# Patient Record
Sex: Male | Born: 1935 | Marital: Married
Health system: Southern US, Community
[De-identification: ages and names within clinical notes are randomized; demographics above are authoritative.]

---

## 2009-01-28 ENCOUNTER — Emergency Department: Payer: Self-pay | Admitting: Emergency Medicine

## 2009-05-30 ENCOUNTER — Emergency Department: Payer: Self-pay | Admitting: Unknown Physician Specialty

## 2010-07-18 ENCOUNTER — Emergency Department: Payer: Self-pay | Admitting: Emergency Medicine

## 2010-07-19 ENCOUNTER — Inpatient Hospital Stay: Payer: Self-pay | Admitting: Internal Medicine

## 2010-09-19 ENCOUNTER — Ambulatory Visit: Payer: Self-pay

## 2010-10-08 IMAGING — US US RENAL KIDNEY
1 series · 17 of 25 positions shown · non-contrast
Comparison: none

REASON FOR EXAM: chest pain resistant hypertension on 4 blood pressure
medications prior renal st
COMMENTS:   LMP: (Male)

PROCEDURE:     US  - US KIDNEY  - January 29, 2009  [DATE]
RESULT:
HISTORY: Hypertension.

[Series 1: us renal kidney · 17 of 29 slices shown]
[im 1/29]
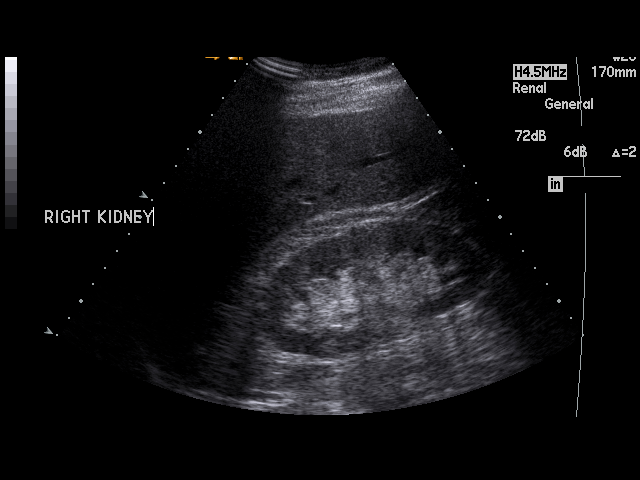
[im 3/29]
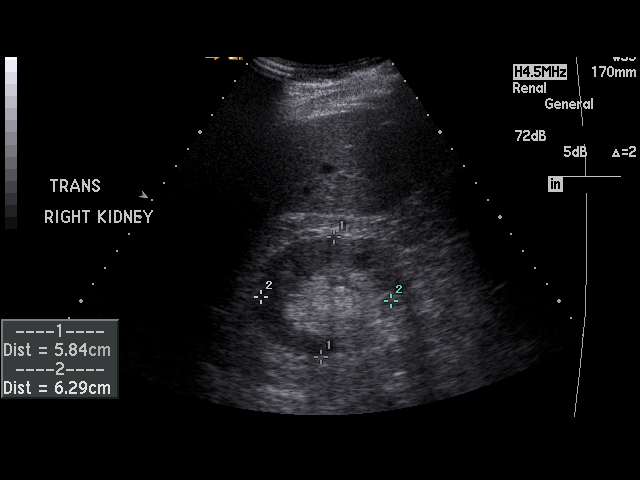
[im 4/29]
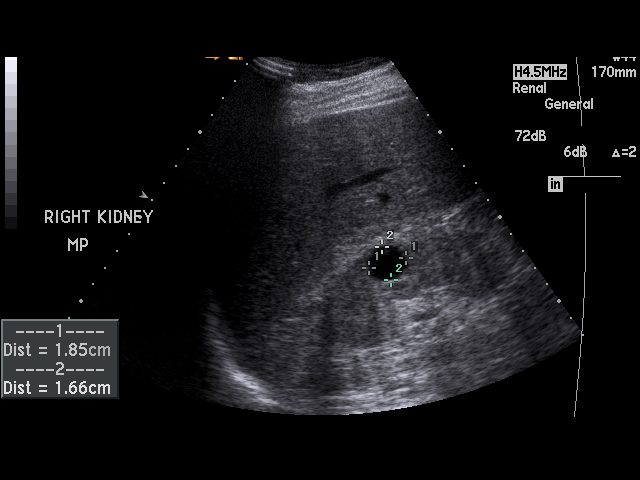
[im 6/29]
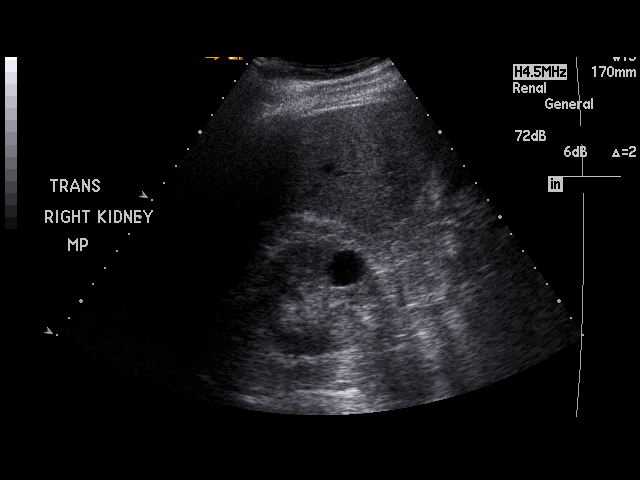
[im 8/29]
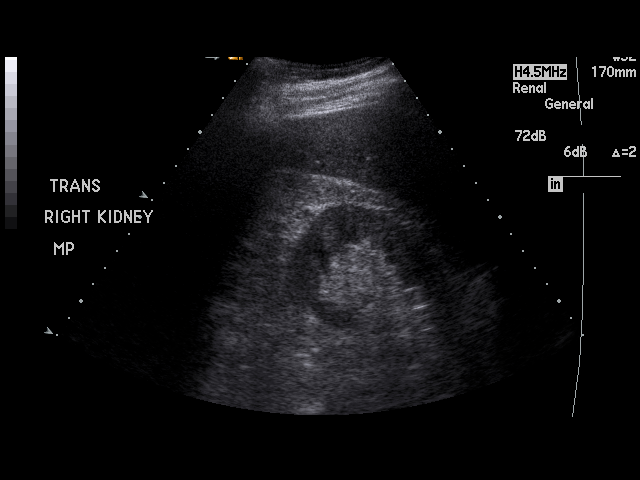
[im 10/29]
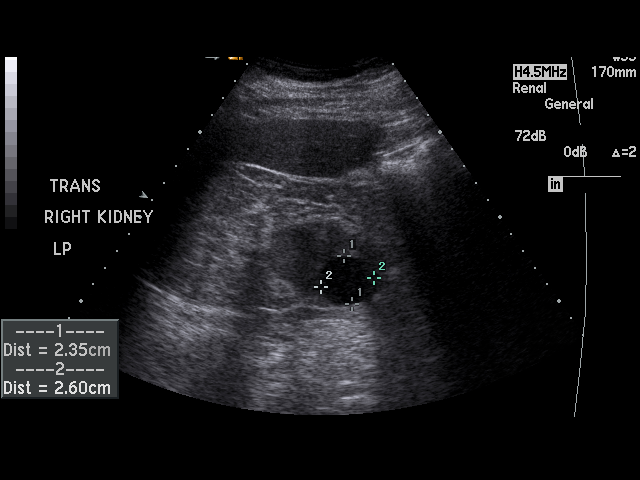
[im 11/29]
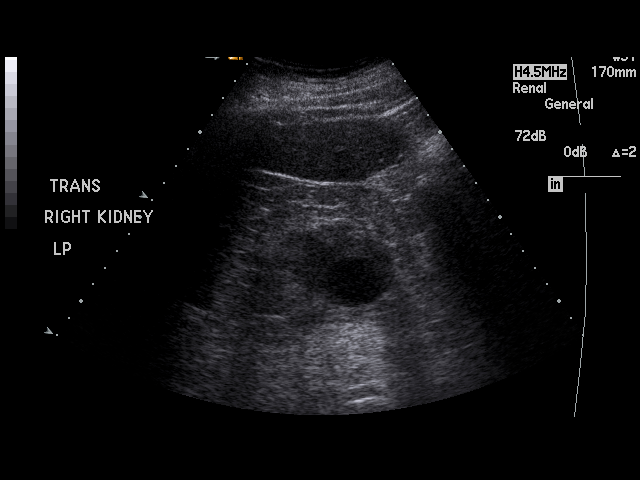
[im 13/29]
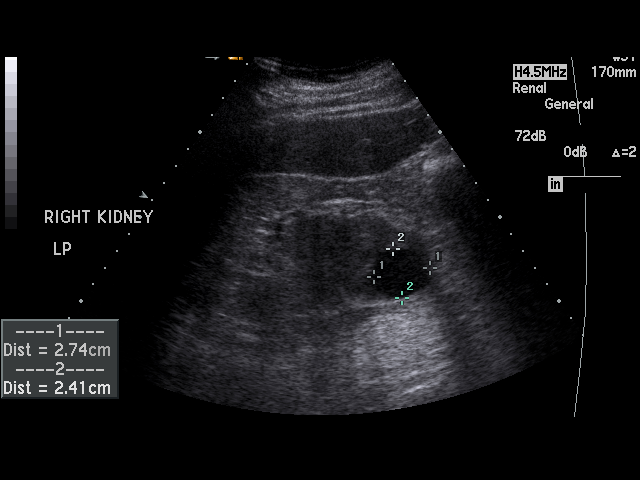
[im 15/29]
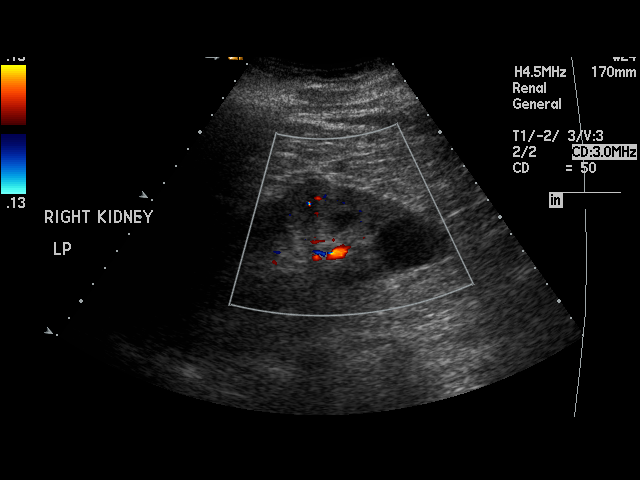
[im 16/29]
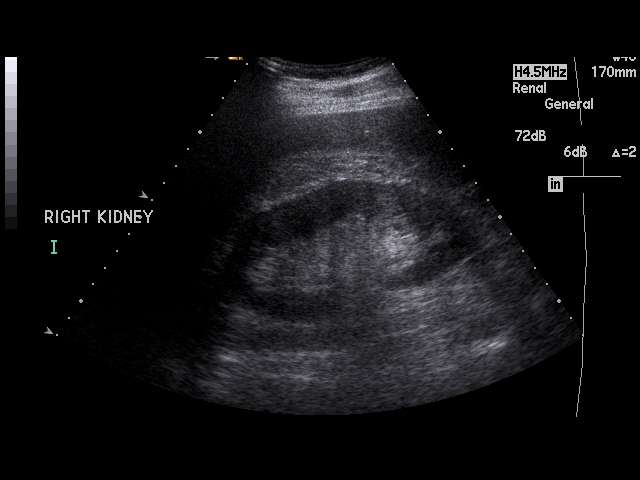
[im 18/29]
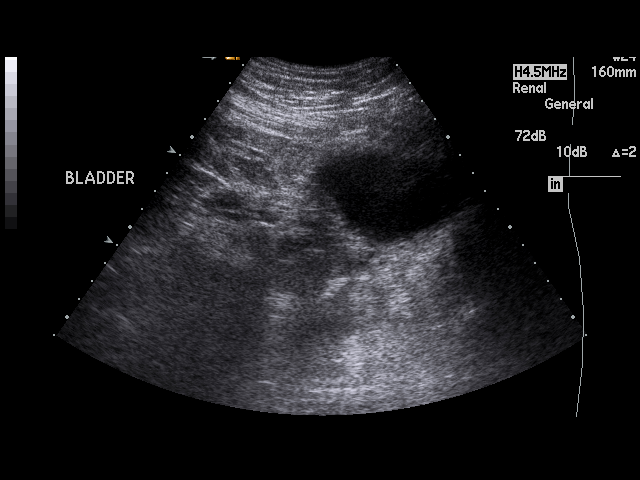
[im 19/29]
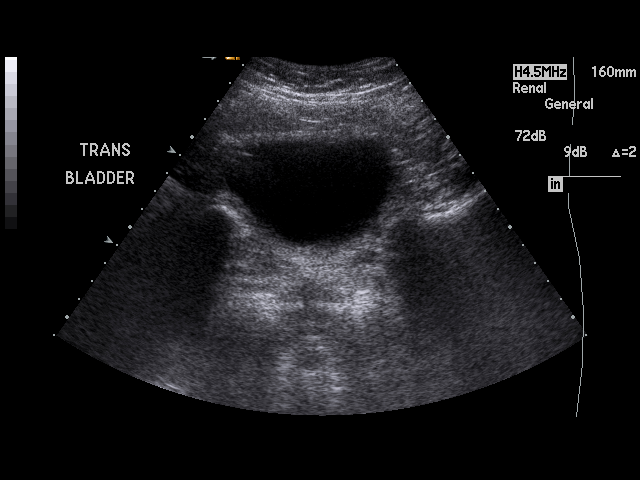
[im 22/29]
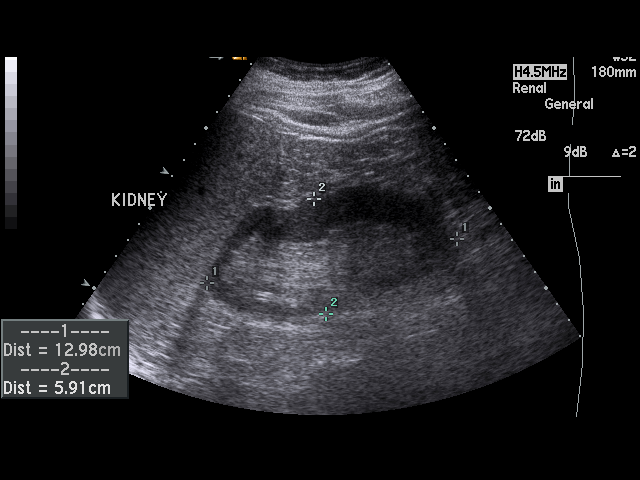
[im 23/29]
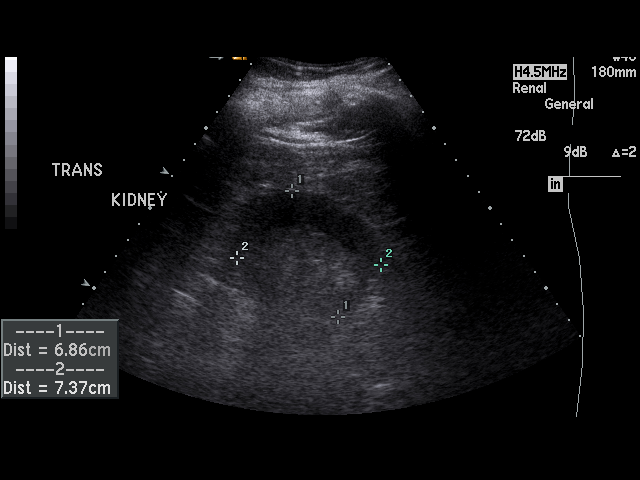
[im 25/29]
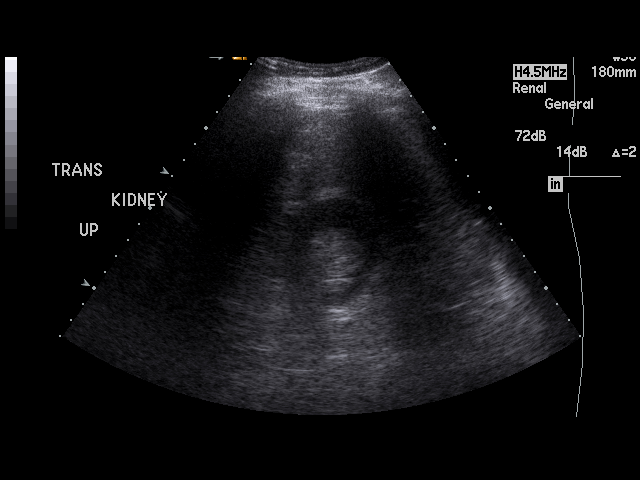
[im 26/29]
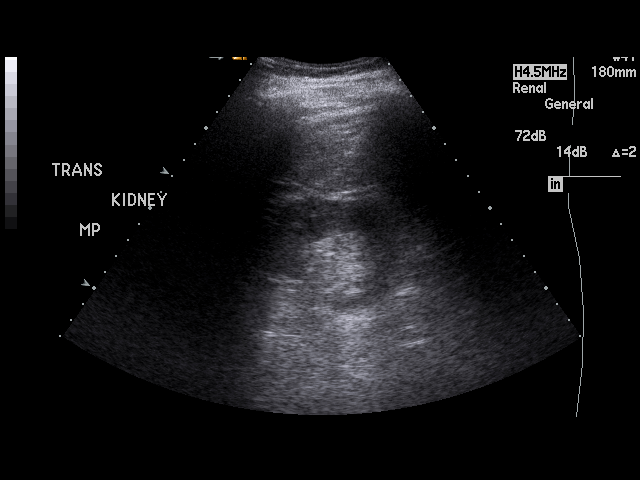
[im 29/29]
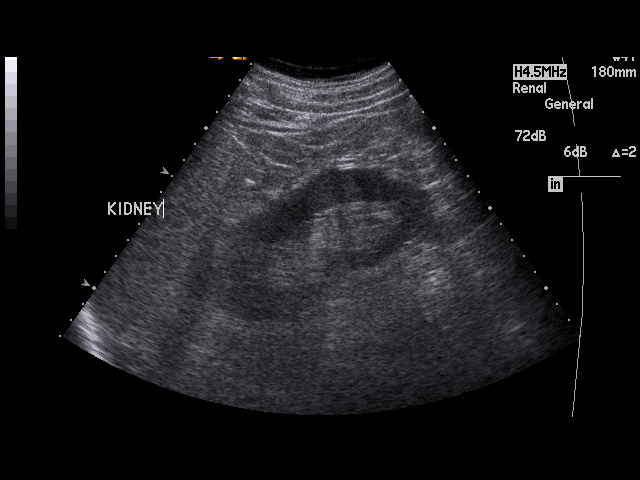

[17 of 25 positions shown; findings below may reference images not displayed]

COMPARISON STUDIES:  No prior.

PROCEDURE AND FININGS:  Standard Renal Ultrasound is obtained.

There is no hydronephrosis. A simple cyst is noted on the right kidney along
its lower pole. Urine is present in the bladder. The right kidney measures
11.1 cm in longitudinal length and the left is 13 cm.
IMPRESSION: No acute abnormality.

## 2011-01-09 ENCOUNTER — Emergency Department: Payer: Self-pay | Admitting: Emergency Medicine

## 2011-01-17 ENCOUNTER — Encounter: Payer: Self-pay | Admitting: Internal Medicine

## 2011-02-04 ENCOUNTER — Encounter: Payer: Self-pay | Admitting: Internal Medicine

## 2011-03-06 ENCOUNTER — Encounter: Payer: Self-pay | Admitting: Internal Medicine

## 2011-05-17 ENCOUNTER — Inpatient Hospital Stay: Payer: Self-pay | Admitting: Student

## 2011-05-21 ENCOUNTER — Inpatient Hospital Stay: Payer: Self-pay | Admitting: Internal Medicine

## 2011-05-22 DIAGNOSIS — R748 Abnormal levels of other serum enzymes: Secondary | ICD-10-CM

## 2011-05-23 DIAGNOSIS — J9600 Acute respiratory failure, unspecified whether with hypoxia or hypercapnia: Secondary | ICD-10-CM

## 2011-05-24 DIAGNOSIS — I059 Rheumatic mitral valve disease, unspecified: Secondary | ICD-10-CM

## 2011-06-06 ENCOUNTER — Ambulatory Visit: Payer: Self-pay | Admitting: Internal Medicine

## 2011-06-12 ENCOUNTER — Encounter: Payer: Self-pay | Admitting: Pulmonary Disease

## 2011-06-13 ENCOUNTER — Institutional Professional Consult (permissible substitution): Payer: Self-pay | Admitting: Pulmonary Disease

## 2011-06-13 ENCOUNTER — Inpatient Hospital Stay: Payer: Self-pay | Admitting: Internal Medicine

## 2011-06-13 DIAGNOSIS — R0602 Shortness of breath: Secondary | ICD-10-CM

## 2011-06-13 LAB — URINALYSIS, COMPLETE
Bacteria: NONE SEEN
Bilirubin,UR: NEGATIVE
Glucose,UR: 50 mg/dL (ref 0–75)
Leukocyte Esterase: NEGATIVE
Nitrite: NEGATIVE
Protein: 100
RBC,UR: 1 /HPF (ref 0–5)
Squamous Epithelial: <1
WBC UR: <1 /HPF (ref 0–5)

## 2011-06-13 LAB — COMPREHENSIVE METABOLIC PANEL
Albumin: 2.7 g/dL — ABNORMAL LOW (ref 3.4–5.0)
Alkaline Phosphatase: 110 U/L (ref 50–136)
BUN: 43 mg/dL — ABNORMAL HIGH (ref 7–18)
Bilirubin,Total: 0.5 mg/dL (ref 0.2–1.0)
Chloride: 103 mmol/L (ref 98–107)
Glucose: 175 mg/dL — ABNORMAL HIGH (ref 65–99)
Osmolality: 296 (ref 275–301)
Potassium: 4.8 mmol/L (ref 3.5–5.1)
SGOT(AST): 20 U/L (ref 15–37)
SGPT (ALT): 20 U/L
Total Protein: 6.8 g/dL (ref 6.4–8.2)

## 2011-06-13 LAB — CBC WITH DIFFERENTIAL/PLATELET
Basophil #: 0 10*3/uL (ref 0.0–0.1)
Basophil %: 0.4 %
Eosinophil #: 0.4 10*3/uL (ref 0.0–0.7)
HCT: 33.6 % — ABNORMAL LOW (ref 40.0–52.0)
HGB: 11 g/dL — ABNORMAL LOW (ref 13.0–18.0)
Lymphocyte #: 1.2 10*3/uL (ref 1.0–3.6)
Lymphocyte %: 15.3 %
MCV: 89 fL (ref 80–100)
Monocyte #: 0.6 10*3/uL (ref 0.0–0.7)
Monocyte %: 8.1 %
Neutrophil #: 5.6 10*3/uL (ref 1.4–6.5)
Platelet: 151 10*3/uL (ref 150–440)
RBC: 3.79 10*6/uL — ABNORMAL LOW (ref 4.40–5.90)
WBC: 7.8 10*3/uL (ref 3.8–10.6)

## 2011-06-13 LAB — TROPONIN I
Troponin-I: <0.02 ng/mL
Troponin-I: <0.02 ng/mL

## 2011-06-13 LAB — PRO B NATRIURETIC PEPTIDE: B-Type Natriuretic Peptide: 7972 pg/mL — ABNORMAL HIGH (ref 0–450)

## 2011-06-13 LAB — CK TOTAL AND CKMB (NOT AT ARMC): CK-MB: <0.5 ng/mL — ABNORMAL LOW (ref 0.5–3.6)

## 2011-06-14 ENCOUNTER — Encounter: Payer: Self-pay | Admitting: Cardiovascular Disease

## 2011-06-14 DIAGNOSIS — I509 Heart failure, unspecified: Secondary | ICD-10-CM

## 2011-06-14 DIAGNOSIS — J9600 Acute respiratory failure, unspecified whether with hypoxia or hypercapnia: Secondary | ICD-10-CM

## 2011-06-14 LAB — BASIC METABOLIC PANEL
Calcium, Total: 8.5 mg/dL (ref 8.5–10.1)
EGFR (Non-African Amer.): 35 — ABNORMAL LOW
Glucose: 107 mg/dL — ABNORMAL HIGH (ref 65–99)
Osmolality: 297 (ref 275–301)
Potassium: 4.3 mmol/L (ref 3.5–5.1)
Sodium: 143 mmol/L (ref 136–145)

## 2011-06-14 LAB — CBC WITH DIFFERENTIAL/PLATELET
Basophil #: 0 10*3/uL (ref 0.0–0.1)
Lymphocyte #: 1.2 10*3/uL (ref 1.0–3.6)
MCH: 28.8 pg (ref 26.0–34.0)
MCV: 88 fL (ref 80–100)
Monocyte #: 0.6 10*3/uL (ref 0.0–0.7)
Monocyte %: 8.6 %
Neutrophil %: 69.7 %
Platelet: 150 10*3/uL (ref 150–440)
RDW: 15.3 % — ABNORMAL HIGH (ref 11.5–14.5)
WBC: 7.4 10*3/uL (ref 3.8–10.6)

## 2011-07-07 ENCOUNTER — Ambulatory Visit: Payer: Self-pay | Admitting: Internal Medicine

## 2012-02-02 ENCOUNTER — Other Ambulatory Visit: Payer: Self-pay | Admitting: Cardiovascular Disease

## 2012-02-05 ENCOUNTER — Emergency Department: Payer: Self-pay | Admitting: Internal Medicine

## 2012-02-05 LAB — COMPREHENSIVE METABOLIC PANEL
Anion Gap: 5 — ABNORMAL LOW (ref 7–16)
BUN: 77 mg/dL — ABNORMAL HIGH (ref 7–18)
Bilirubin,Total: 0.4 mg/dL (ref 0.2–1.0)
Chloride: 104 mmol/L (ref 98–107)
Co2: 29 mmol/L (ref 21–32)
Creatinine: 2.89 mg/dL — ABNORMAL HIGH (ref 0.60–1.30)
Osmolality: 303 (ref 275–301)
Potassium: 4.9 mmol/L (ref 3.5–5.1)
SGPT (ALT): 12 U/L (ref 12–78)
Sodium: 138 mmol/L (ref 136–145)
Total Protein: 6.6 g/dL (ref 6.4–8.2)

## 2012-02-05 LAB — CK TOTAL AND CKMB (NOT AT ARMC)
CK, Total: 29 U/L — ABNORMAL LOW (ref 35–232)
CK-MB: <0.5 ng/mL — ABNORMAL LOW (ref 0.5–3.6)

## 2012-02-05 LAB — CBC
MCH: 30.1 pg (ref 26.0–34.0)
MCHC: 33.1 g/dL (ref 32.0–36.0)
MCV: 91 fL (ref 80–100)

## 2012-02-05 LAB — PRO B NATRIURETIC PEPTIDE: B-Type Natriuretic Peptide: 3177 pg/mL — ABNORMAL HIGH (ref 0–450)

## 2012-03-26 IMAGING — CR DG CHEST 1V PORT
1 series · 1 of 1 positions shown · non-contrast
Comparison: none

REASON FOR EXAM: shortness of breath
COMMENTS:

PROCEDURE:     DXR - DXR PORTABLE CHEST SINGLE VIEW  - July 18, 2010 [DATE]
RESULT:     Comparison: 05/30/2009

[view not recorded]
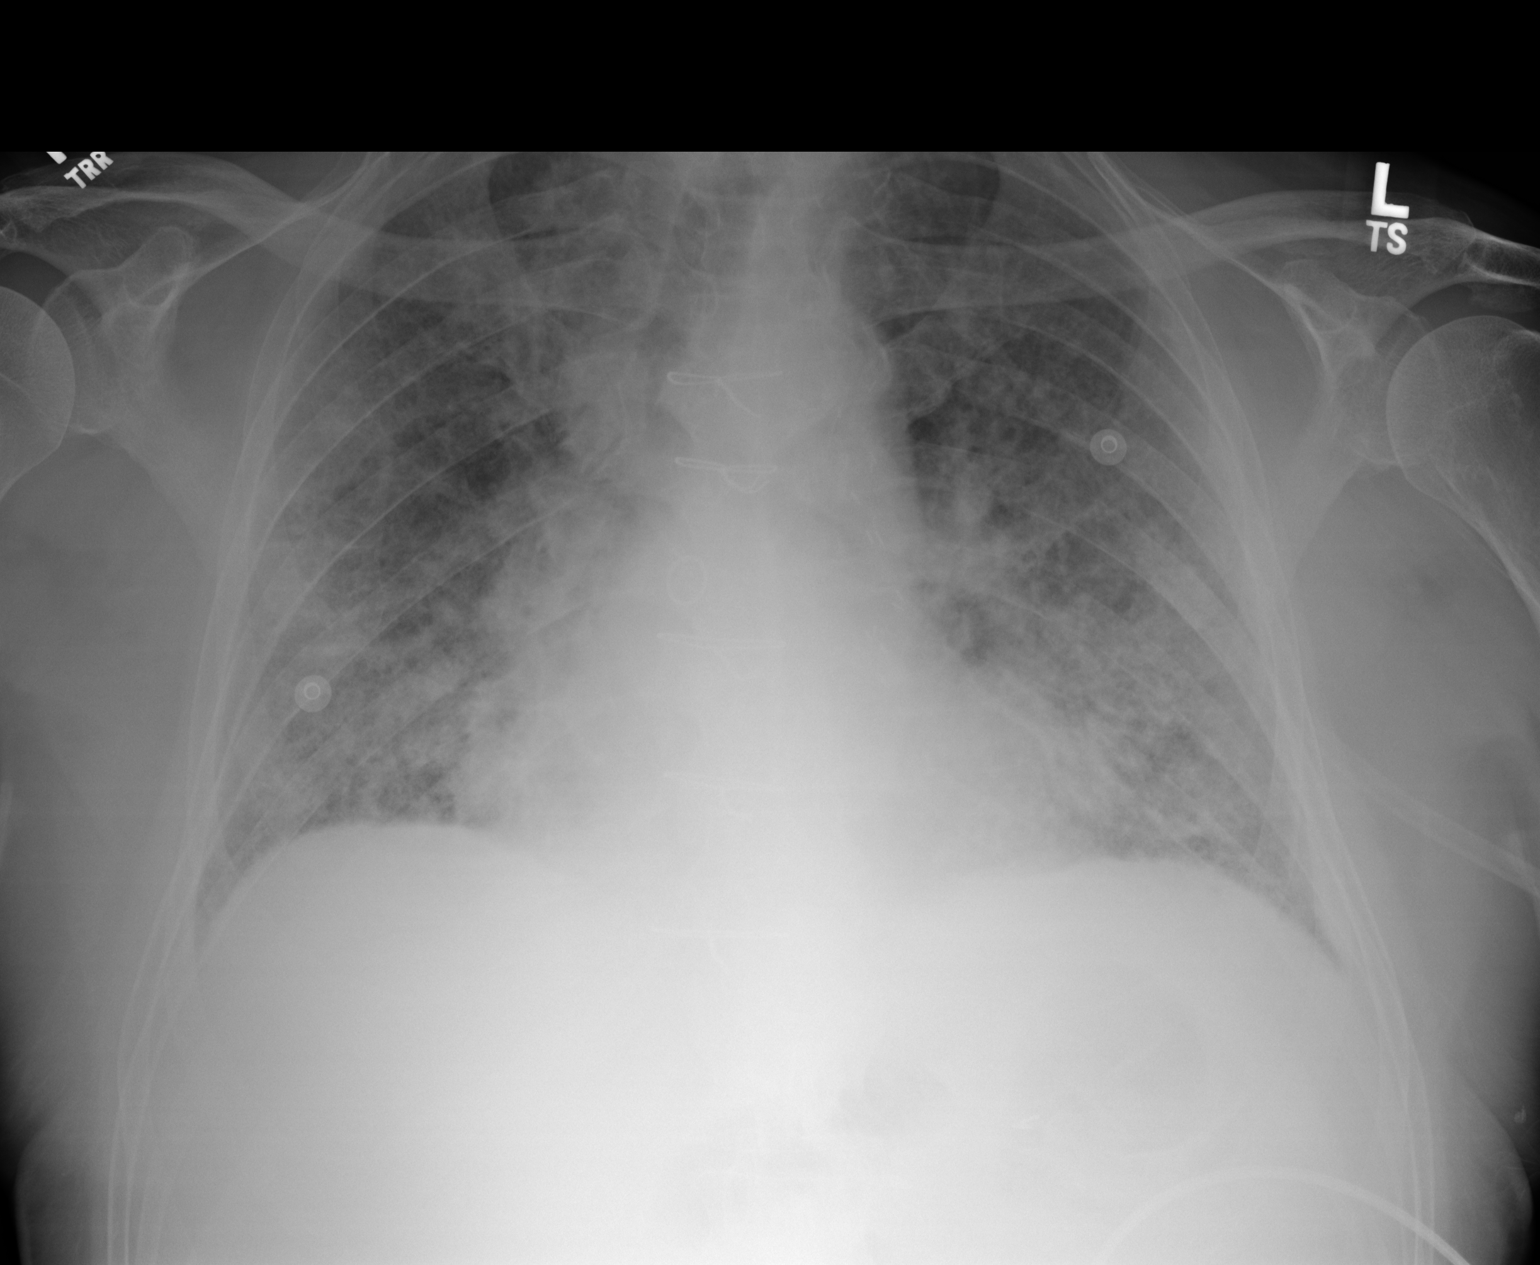

[1 of 1 positions shown; findings below may reference images not displayed]

FINDINGS: Single portable AP chest radiograph is provided. There is bilateral diffuse
interstitial thickening. There is more patchy alveolar airspace opacities in
the lower lobes bilaterally. There is no pleural effusion or pneumothorax.
Normal cardiomediastinal silhouette. The osseous structures are unremarkable.
IMPRESSION: Bilateral diffuse or central thickening with slightly more confluent areas
of alveolar airspace opacities in the lower lobes bilaterally. The side
represents chronic interstitial lung disease with, but superimposed mild
pulmonary edema or interstitial pneumonitis cannot be excluded.

## 2012-06-05 DEATH — deceased

## 2013-01-26 IMAGING — CR DG CHEST 1V PORT
1 series · 1 of 1 positions shown · non-contrast
Comparison: none

REASON FOR EXAM: SOB
COMMENTS:

PROCEDURE:     DXR - DXR PORTABLE CHEST SINGLE VIEW  - May 20, 2011 [DATE]
RESULT:     Comparison is made to a prior study dated 05/17/2011.

[ap]
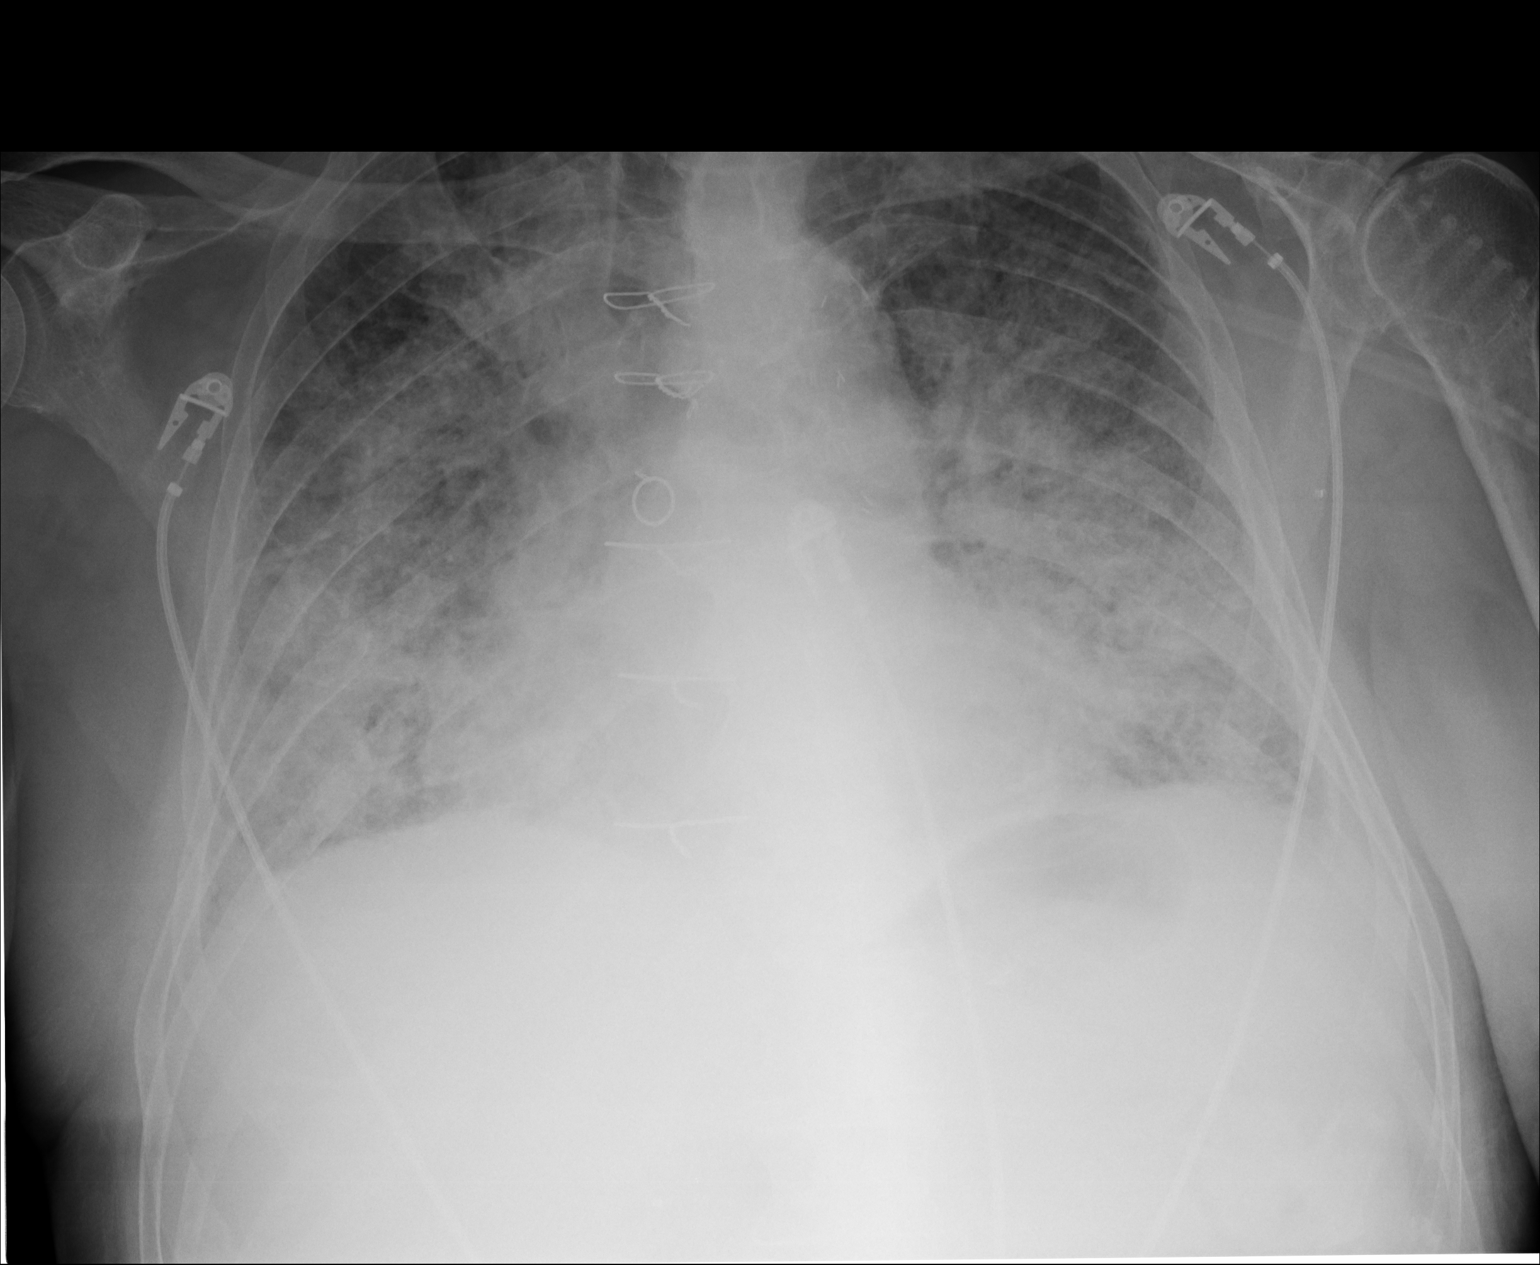

[1 of 1 positions shown; findings below may reference images not displayed]

FINDINGS: Diffuse perihilar opacities are identified as well as diffuse
opacities in the right and left hemithoraces. There is thickening of the
interstitial markings and pulmonary vascular peribronchial cuffing. The
cardiac silhouette is enlarged. The visualized bony skeleton is unremarkable.
IMPRESSION: Findings consistent with pulmonary edema. An underlying infectious
infiltrate is also of diagnostic consideration if clinically appropriate.
Surveillance evaluation is recommended status post appropriate therapeutic
regimen.

## 2013-10-14 IMAGING — CR DG CHEST 2V
1 series · 2 of 2 positions shown · non-contrast
Comparison: none

REASON FOR EXAM: dizziness
COMMENTS:

[Series 1: w chest pa · 0.14mm/px · 2 of 2 slices shown]
[im 1/2]
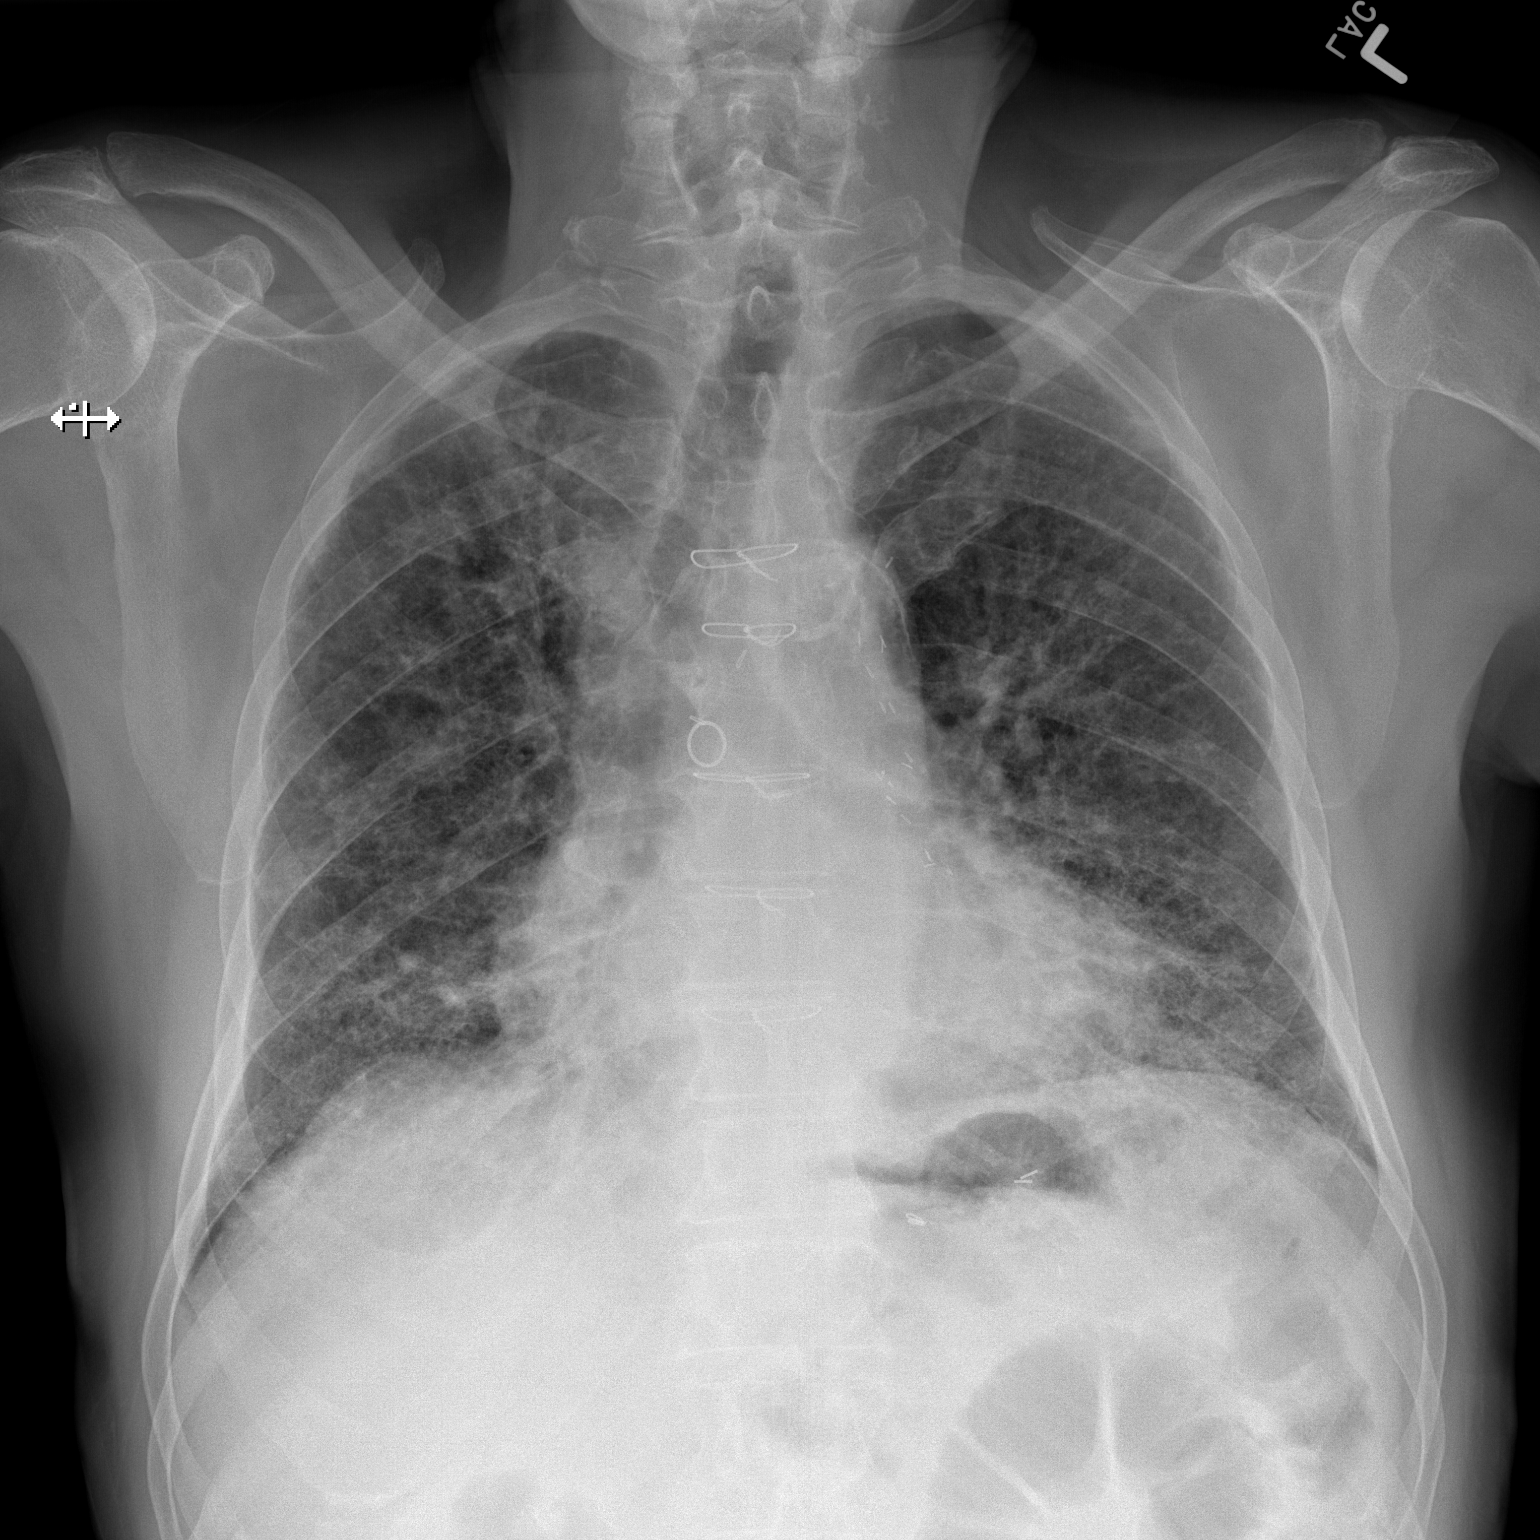
[im 2/2]
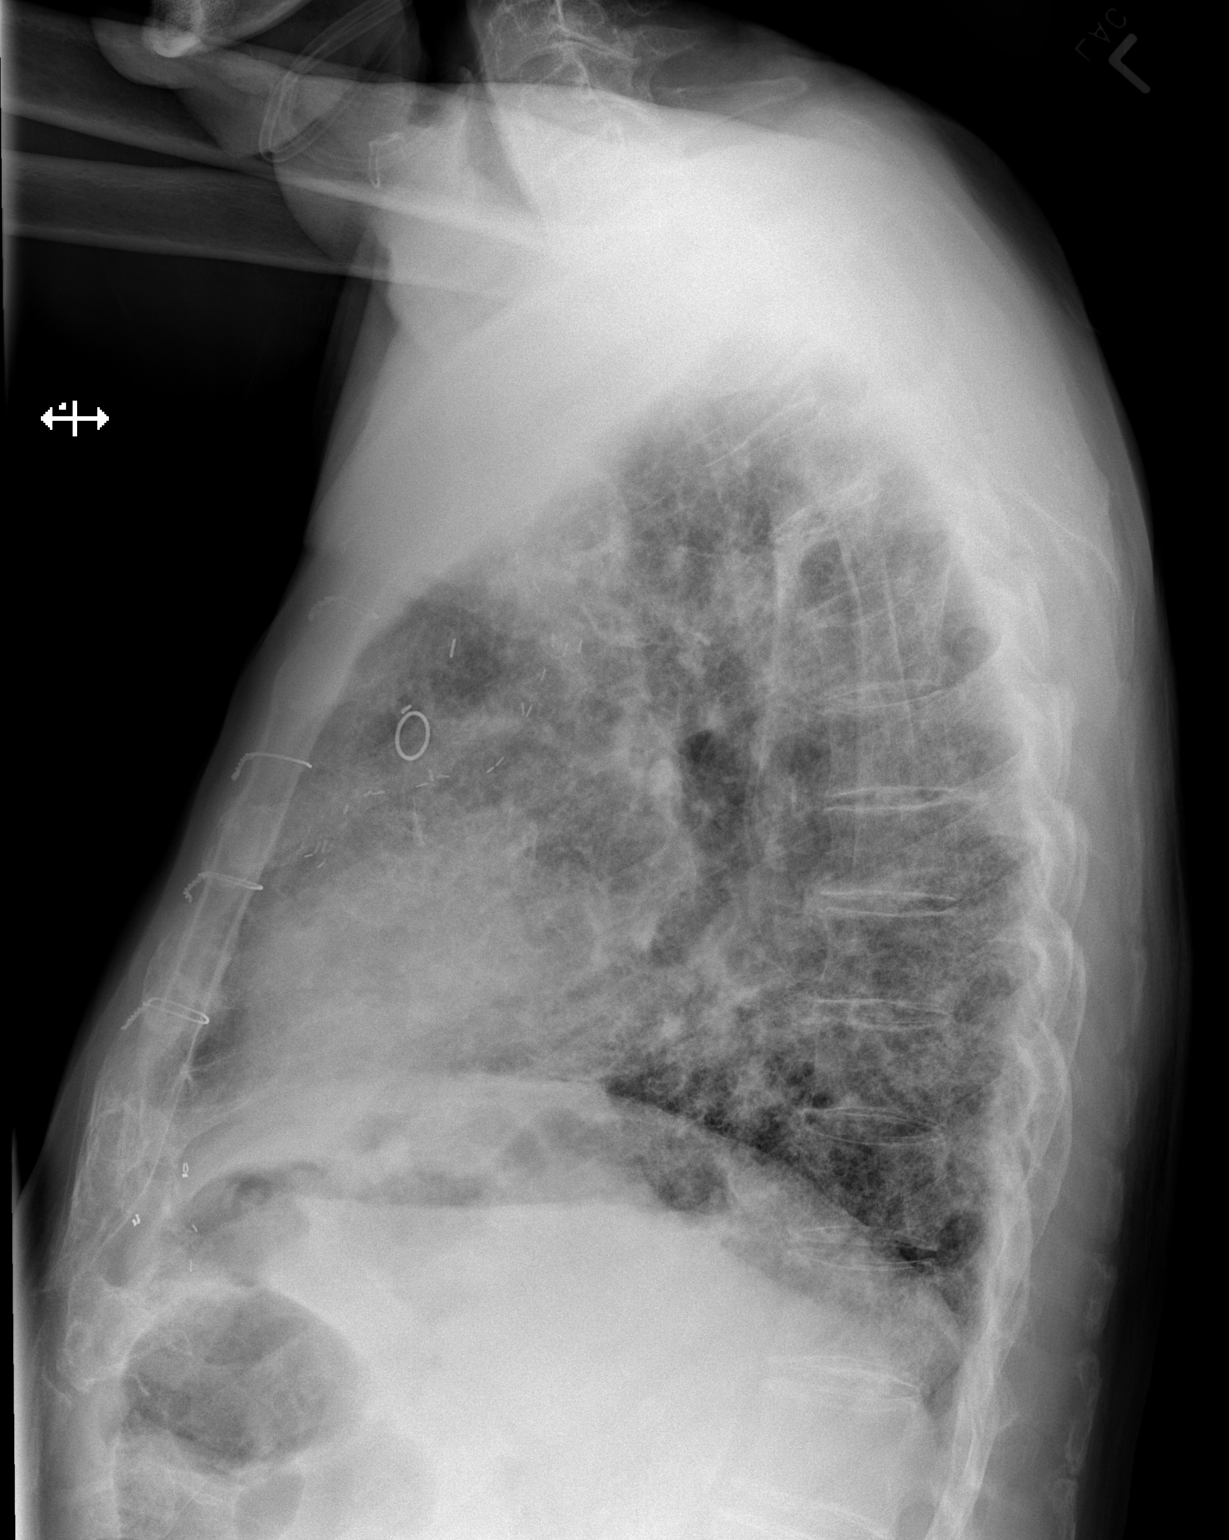

[2 of 2 positions shown; findings below may reference images not displayed]

PROCEDURE:     DXR - DXR CHEST PA (OR AP) AND LATERAL  - February 05, 2012  [DATE]

RESULT:     Comparison is made to the study 13 June, 2011.

CABG changes are present. There is extensive prominence of the interstitial
markings. The cardiac silhouette is enlarged. There is no significant
effusion. There is no focal consolidation or mass. The appearance is similar
but slightly improved from the previous study.
IMPRESSION: 1. Findings are consistent with diffuse interstitial edema versus
nonedematous infiltrate with mild cardiomegaly and CABG change. Correlate
for CHF. No effusion is present.

[REDACTED]

## 2014-09-27 NOTE — Consult Note (Signed)
PATIENT NAME:  Warren PlumberWAGNER, Warren J MR#:  [id number] DATE OF BIRTH:  23-Oct-1928  DATE OF CONSULTATION:  05/22/2011  REFERRING PHYSICIAN:   CONSULTING PHYSICIAN:  Duke SalviaSteven C. Klein, MD  Thank you very much for asking Warren Shepard to see Warren Shepard in consultation because of positive troponins.   HISTORY OF PRESENT ILLNESS: He is an 81 year old gentleman with a history of congestive heart failure. Prior bypass surgery many years ago, he thinks greater than 15, with a stress test most recently 3 to 5 years ago done [facility]. He has had a number of hospitalizations over the last year for shortness of breath including last week when he presented with shortness of breath, scratchy throat and a runny nose. He was hospitalized from Wednesday to Friday and discharged. His troponins notably at that time were normal.    He came back the following night with more back pain, now also associated with chest burning, neither of which are totally unique and at this point, he was also severely short of breath with his oxygen saturation measuring 50%. He was treated with diuresis, oxygen support and his troponins came back abnormal.   As noted, he has had recurrent problems with chest pain and back pain that are not necessarily related to exertion, which were the accompanying symptoms on his presentation.   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 He also has a history of peripheral vascular disease with lower extremity bypass. Ejection fraction in the past has been recorded in the 35-45% range, most recently evaluated in 2012. At that time, he also had severe mitral regurgitation and moderate TR.   PAST MEDICAL HISTORY: In addition to the above, is notable for: 1. Pulmonary hypertension.  2. Pulmonary fibrosis as noted.  3. Diabetes.  4. Hyperlipidemia.  5. Gout.    PAST SURGICAL HISTORY: As noted previously with his vascular  disease.   FAMILY HISTORY: Negative for heart disease. He does not use cigarettes or alcohol or recreational drugs.   SOCIAL HISTORY: He is married. He lives with his wife. 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   REVIEW OF SYSTEMS: As noted previously.   ALLERGIES: Allergies are notable for hydralazine and terazosin.   ADMISSION MEDICATIONS:  1. Finasteride. 2. Ranitidine. 3. Plavix. 4. Aspirin. 5. Lisinopril. 6. Simvastatin. 7. Glipizide. 8. Isosorbide. 9. Coreg. 10. Allopurinol.  11. Ipratropium.  12. Lasix.  13. Household medications are notable for IV diuretics and heparin.   PHYSICAL EXAMINATION:  GENERAL: He is an elderly Caucasian male appearing his stated age. He is in no acute distress.   VITAL SIGNS: Blood pressure 147/70 with a pulse of 66. He was afebrile. Respirations were 19.   HEENT: Normal.   NECK: Neck veins were 6 to 7 cm. His carotids are brisk and full bilaterally without bruits.   BACK: Without kyphosis or scoliosis.   LUNGS: Had diffuse rhonchi and decreased breath sounds.   HEART: Heart sounds were regular with a 3/6 systolic murmur at the apex.   ABDOMEN: Soft with active bowel sounds. There was no hepatojugular reflux.   EXTREMITIES: Femoral pulses were 2+. Distal pulses were trace. There was no clubbing or cyanosis. There is trace peripheral edema.   NEUROLOGIC: Grossly normal.   SKIN: The skin was warm and dry.   PSYCHIATRIC: Affect was engaging. There was no lymphadenopathy.   LABORATORY, RADIOLOGICAL AND  DIAGNOSTIC DATA: Laboratories were notable for a troponin of 1.54 as a peak that was measured on arrival. His creatinine is 2.27 with estimated GFR of 30. Potassium was normal. BNP was elevated at greater than 11,900. Blood gas is notable on arrival with a pCO2 of 57.   IMPRESSION:  1. Non-STEMI.  2. Respiratory distress associated with: a. Congestive heart failure--systolic. b. 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  3. Renal insufficiency.   DISCUSSION: Warren Shepard has a non-STEMI in the setting of renal insufficiency that is subacute following discharge on Friday. I think the primary event is an ischemic event, which is not unexpected in 81 year old bypass grafts. Potentially, it also could be a pulmonary issue with very low saturations as detected by his daughter (about 50%) being sufficient to cause cardiac ischemia as a secondary event.   Risk stratification I think with Myoview scanning would be appropriate. Given his wheezing, I would like to defer it for about 24 hours so as to minimize reactive airways. This could be used as a stratifier of risk for catheterization which we would like to avoid given his renal insufficiency.   Right now, we will undertake an echo. I will continue aspirin and Plavix. He is on aspirin and beta blockers. Anticipate a Myoview scan on Wednesday.   Thank you for the consultation.    ____________________________ Duke Salvia, MD sck:ap D: 05/22/2011 16:43:22 ET T: 05/22/2011 17:13:28 ET JOB#: [id number]  cc: Duke Salvia, MD, <Dictator> Duke Salvia MD ELECTRONICALLY SIGNED 08/02/2011 16:16

## 2014-09-27 NOTE — H&P (Signed)
PATIENT NAME:  Warren Shepard, Warren Shepard MR#:  [id number] DATE OF BIRTH:  [birth year]  DATE OF ADMISSION:  06/13/2011  REFERRING PHYSICIAN: Dr. Darnelle Catalan.   PRIMARY CARE PHYSICIAN: [facility]. From previous discharge, she was recommended to follow up with Dr. Mariah Milling, [facility] as well as [facility].   PRESENTING COMPLAINT: Back pain, chest pain, hypoxia.   HISTORY OF PRESENT ILLNESS: Warren Shepard is a pleasant 81 year old gentleman with history of pulmonary fibrosis, diastolic and systolic dysfunction, congestive heart failure, pulmonary hypertension, hyperlipidemia, diabetes, coronary artery disease, chronic kidney disease, hypertension, peripheral vascular disease who represents after recently being discharged on 05/25/2011. He represents with complaints of coming back from the bathroom and developing back pain and then chest pain and noted that his oxygen sats dropped to 72% on his 3 liters oxygen supplementation and had also shortness of breath. He sat down, took deep breaths and his oxygen began to recover. His chest pain improved mildly, but has had complete resolution status post his treatment in the Emergency Room. He reports that his nonproductive cough is still ongoing despite treatment during his last two admissions. This will be his third admission to the hospital since December 2012. The patient denies any palpitations or arrhythmia. No presyncope or syncope. No change in his weight. He has been monitoring his weight daily. No worsening lower extremity swelling. He describes his pain as being on the left side and dull. No radiation. No associated nausea, vomiting, or diaphoresis, but did have the shortness of breath.   PAST MEDICAL HISTORY:  1. Last admitted 12/16 to 05/25/2011 with acute on chronic respiratory failure, thought to be in the setting of underlying pulmonary fibrosis, bronchitis, chronic obstructive pulmonary disease, congestive heart failure and hospital course  was complicated by malignant hypertension as well as non-ST elevation myocardial infarction and acute on chronic kidney failure.  2. Pulmonary fibrosis diagnosed October 2010 on oxygen of 2 liters at rest and 3 liters on exertion.  3. History of diastolic and systolic dysfunction congestive heart failure. Echo was performed on last admission 05/24/2011 with ejection fraction of 40 to 45%, impaired LV relaxation and right ventricular systolic pressure 30 to 40 mm of mercury.  4. Probable chronic obstructive pulmonary disease.  5. Pulmonary hypertension.  6. Dyslipidemia.  7. Diabetes.  8. Coronary artery disease status post bypass and stent.  9. Chronic kidney disease, stage III, with baseline creatinine around 1.8 to 2.  10. Hypertension.  11. Peripheral vascular disease status post bypass for the right iliac.  12. Gout.  13. Benign prostatic hypertrophy.  14. Questionable brain lesion, which he is prophylactically on Keppra.   PAST SURGICAL HISTORY:  1. Coronary artery bypass.  2. Right iliac bypass.  3. Cyst resection from neck and jaw.  4. Cataract surgery bilaterally.   SOCIAL HISTORY: He lives in [place] with his wife. Quit tobacco about 18 years ago. No alcohol or drug use. He is retired from [employer], previously worked with Patent examiner.   FAMILY HISTORY: Brother had lung cancer. His father also died of lung cancer.   REVIEW OF SYSTEMS: CONSTITUTIONAL: No fevers. He endorses chills. No nausea or vomiting. EYES: History of cataract. ENT: No epistaxis, discharge or dysphagia. RESPIRATORY: He has nonproductive cough for the past 3 to 4 weeks at least and some intermittent wheezing. No hemoptysis. He endorsed shortness of breath, worsening from his baseline with chest pain as per history of present illness. CARDIOVASCULAR: As per history of present illness. GASTROINTESTINAL: No  nausea or vomiting. He has mainly constipation. No abdominal pain, hematemesis, or melena. GU: No  dysuria or hematuria. ENDOCRINE: No polyuria or polydipsia. HEMATOLOGIC: No easy bleeding. He has easy bruising. SKIN: No ulcers. MUSCULOSKELETAL: Denies any neck pain or joint swelling. Reports back pain as per history of present illness. NEUROLOGIC: No one-sided weakness or numbness. No history of strokes or seizures. PSYCH: Denies any depression or suicidal ideation.   PHYSICAL EXAMINATION:  VITAL SIGNS: Pulse 81, respiratory rate currently in the mid 20s, blood pressure 188/91, sating at 100% on BiPAP.   GENERAL: Sitting in bed in no apparent distress.   HEENT: Normocephalic, atraumatic. Pupils are equal and symmetric. He has BiPAP in place.   NECK: Soft and supple. No adenopathy. He has slightly elevated JVP of approximately 9 cm.   CARDIOVASCULAR: Non-tachy. No murmurs, rubs, or gallops.   LUNGS: Basilar crackles. No use of accessory muscles or increased respiratory effort. He has distant breath sounds.   ABDOMEN: Soft, nontender, positive bowel sounds. No mass appreciated.   EXTREMITIES: Trace edema bilaterally. Dorsal pedis pulses intact.   MUSCULOSKELETAL: No joint effusion.   SKIN: No ulcers.   NEUROLOGIC: Symmetrical strength. No focal deficits.   PSYCH: He is alert and oriented. The patient is cooperative.   PERTINENT LABS AND STUDIES: ABG with pH of 7.38, pCO2 of 60, pO2 141, bicarbonate 35.5 on BiPAP, WBC 7.8, hemoglobin 11, hematocrit 33.6, platelet 151, MCV 89, glucose 175, BUN 43, creatinine 2.06, sodium 141, potassium 4.8, chloride 103, carbon dioxide 34, calcium 8.8. LFTs within normal limits. Albumin 2.7, troponin less than 0.02. BNP is 7,972. EKG with sinus rate of 79 with PVCs. No ST changes. Chest x-ray with pulmonary fibrosis and pulmonary edema.   ASSESSMENT AND PLAN: Warren Shepard is an 81 year old gentleman with history of pulmonary fibrosis, on chronic oxygen, probable chronic obstructive pulmonary disease, diastolic and systolic dysfunction, congestive heart  failure, pulmonary hypertension, coronary artery disease and peripheral vascular disease status post bypass, diabetes, hypertension, hyperlipidemia, presenting with complaints of back pain, chest pain, shortness of breath and hypoxia.  1. Acute on chronic respiratory failure, multifactorial with underlying pulmonary fibrosis, congestive heart failure, likely worsened with congestive heart failure.  2. Pulmonary hypertension, likely worsened with elevated blood pressure, still with ongoing dry cough. This is his third admission since the beginning of December 2012. First troponin is negative. His creatinine is at baseline. Continue on tele. Cycle cardiac enzymes. Continue BiPAP. Restart Plavix, aspirin, carvedilol lisinopril, Lipitor and Imdur. Continue nitroglycerin patch. Continue with gentle diurese with IV Lasix. Continue with daily weights and input and output. Continue SVNs. Chest pain has resolved. His last admission course was complicated by non-ST elevation myocardial infarction. The patient was treated medically at that time. He was to follow-up with Dr. Mariah MillingGollan. His echo was performed with results as dictated above. Will reconsult cardiology for further recommendations. Will also obtain palliative consultation.  3. Hypertension, uncontrolled, possibly the etiology of pain and flash edema. Restart his medications as above and continue on nitro patch.  4. Diabetes. Restart glipizide and Novolin N. Start on sliding scale insulin.  5. Questionable brain lesion. The patient unclear of details. We will restart his Keppra. 6. Chronic kidney disease, stable, follow closely with diuresis and ACE inhibitor.  7. Prophylaxis with aspirin, Plavix, heparin subcutaneous and ranitidine.   TIME SPENT: Approximately 50 minutes were spent on patient care.   ____________________________ Reuel DerbyAlounthith Laronica Bhagat, MD ap:ap D: 06/13/2011 04:19:13 ET T: 06/13/2011 09:03:29 ET JOB#: [id number]  cc: Rickie Gange, MD,  <Dictator> [facility] Reuel Derby MD ELECTRONICALLY SIGNED 06/24/2011 2:26

## 2014-09-27 NOTE — Discharge Summary (Signed)
PATIENT NAME:  Warren Shepard, Warren Shepard MR#:  [id number] DATE OF BIRTH:  [birth year]  DATE OF ADMISSION:  06/13/2011 DATE OF DISCHARGE:  06/15/2011  ADMITTING DIAGNOSES: Back pain, chest pain, and shortness of breath.  DISCHARGE DIAGNOSES:  1. Chest pain, possible angina equivalence. The patient may have underlying obstructive coronary artery disease, however, due to his renal failure and progressive pulmonary fibrosis he is not felt to be an appropriate candidate for cardiac catheterization at this point.  2. Acute respiratory failure requiring brief episode of BiPAP, felt to be multifactorial including systolic congestive heart failure and pulmonary fibrosis with possible acute bronchitis. Now the patient is back to nasal cannula oxygen and his breathing is currently at baseline.  3. Poorly controlled blood pressure, currently stable.  4. Chronic kidney disease stage III, renal function close to baseline.  5. Anemia likely due to anemia of chronic disease with decrease in hemoglobin, baseline hemoglobin is around 9.5.  6. Coronary artery disease with recent myocardial infarction, currently medical management is recommended, status post bypass and stent.  7. Chronic diastolic and systolic congestive heart failure based on echocardiogram performed on 05/24/2011.  8. Probable chronic obstructive pulmonary disease.  9. Pulmonary hypertension.  10. Dyslipidemia.  11. Diabetes.  12. Hypertension.  13. Peripheral vascular disease, status post bypass of the right iliacs.  14. Gout.  15. Benign prostatic hypertrophy.  16. Questionable brain lesion, prophylactically on Keppra.  17. Status post coronary artery bypass graft.  18. Status post right iliac bypass.  19. Cyst resection from neck and jaw.  20. Cataracts removed surgically bilaterally.  PERTINENT LABS/STUDIES: ABG: pH 7.38, pCO2 60, pO2 141, AND bicarbonate 35.5. WBC count 7.8, hemoglobin 11, hematocrit 33.6, glucose 175, BUN 43, creatinine 2.06,  sodium 141, potassium 4.8, chloride 103, CO2 34, AND calcium 8.8. LFTs were normal. Albumin 2.7. Troponin less than 0.02. BNP 7972.   EKG showed sinus rhythm with PVCs. No ST changes.   Chest x-ray showed pulmonary fibrosis with pulmonary edema.  Most recent WBC count on 06/14/2011 was 7.4, hemoglobin 8.8, and platelet count 150. Creatinine 1.96. Troponins x3 were less than 0.02.   CODE STATUS: DO NOT RESUSCITATE.   CONSULTANTS: Julien Nordmannimothy Gollan, MD  HISTORY/HOSPITAL COURSE: Please see the History and Physical  done by the admitting physician, Dr. Pearlean BrownieAlounthith Phichith.   The patient is an 81 year old white male with pulmonary fibrosis, pulmonary hypertension, history of congestive heart failure, diabetes, hyperlipidemia, and coronary artery disease who was recently hospitalized with acute respiratory failure, non-ST myocardial infarction, who was discharged on 05/25/2011 who presented back to the ED with complaint of having chest pain and shortness of breath. Initially the patient had to be placed briefly on BiPAP. He was felt to have acute congestive heart failure along with possible acute bronchitis. For these symptoms, he was admitted. He was diuresed well and his symptoms started improving. The patient's respiratory status did improve and he is close to baseline. He has been seen by home hospice and palliative care. Per his request, he is made DO NOT RESUSCITATE. Home hospice will follow the patient at home. At this time, he is stable to be discharged.   DISCHARGE ACTIVITY: As tolerated.   DISCHARGE OXYGEN: 3 liters at all times.  DISCHARGE INSTRUCTIONS: The patient is to weigh himself every day at the same time, preferably first thing in the morning after urinating and before eating, and call physician if gain of more than 2 pounds in one day or 5 pounds in one week,  for increased weight gain, tiredness, swelling of the legs or feet, or shortness of breath called MD.   DISCHARGE MEDICATIONS:   1. Ranitidine 150 mg daily.  2. Plavix 75 p.o. daily.  3. Flunisolide 25 mcg two sprays twice a day. 4. Altamist 0.65% nasal solution one spray nasally twice a day. 5. Carvedilol 12.5 mg twice a day. 6. Allopurinol 100 mg one tab p.o. daily.  7. Lasix 40 mg twice a day. 8. Aspirin 81 mg one tab p.o. daily.  9. Imdur 30 mg twice a day. 10. Keppra 250 milligrams 1 tab p.o. twice a day. 11. Nitroglycerin 0.4 sublingual every 5 minutes as needed.  12. Finasteride 5 mg daily.  13. Ipratropium 21 mcg to each nostril twice a day. 14. Lisinopril 20 mg daily.  15. Lipitor 80 mg at bedtime.  16. Vitamin D 50,000 international units once a month. 17. Proventil 2 puffs every 6 hours p.r.n. wheezing.  18. Levaquin 500 mg p.o. daily x4 days. 19. The patient is told not to take glipizide and NPH insulin today, to check his blood glucose today throughout the day. If blood glucose is greater than 140 in the a.m. tomorrow then he can restart his glipizide and NPH.   HOME OXYGEN: Yes - at 4 liters.   DIET: Low sodium, ADA diet.   ACTIVITY: As tolerated.   REFERRAL: Home hospice.          TIMEFRAME FOR FOLLOWUP: Followup in 1 to 2 weeks with Dr. Vivien Rota at the [facility].   TIME SPENT: 35 minutes.  ____________________________ Lacie Scotts Allena Katz, MD shp:slb D: 06/15/2011 10:04:20 ET (Entered as incorrect work type - 02) T: 06/15/2011 11:06:08 ET JOB#: [id number]  cc: Shykeria Sakamoto H. Allena Katz, MD, <Dictator> Dr. Vivien Rota [facility]  Charise Carwin MD ELECTRONICALLY SIGNED 06/20/2011 14:23

## 2014-09-27 NOTE — Consult Note (Signed)
    Comments   Followup visit with Palliative Care Team. Pt states he discussed code status with pastor and family. Pt tells us he would not want to be placed on ventilator and would prefer DNR. Pt says daughter not in agreement but assures us family will respect his decision. Will sign portable DNR per pt request.  10 minutes.   Electronic Signatures: Chrishana Spargur, Daryl EasternJoshua R (NP)  (Signed 08-Jan-13 17:40)  Authored: Palliative Care Phifer, Harriett SineNancy (MD)  (Signed 08-Jan-13 20:30)  Authored: Palliative Care   Last Updated: 08-Jan-13 20:30 by Phifer, Harriett SineNancy (MD)

## 2014-09-27 NOTE — Consult Note (Signed)
General Aspect Warren Shepard is a pleasant 81 year old gentleman with history of pulmonary fibrosis, diastolic and systolic dysfunction (EF 40 to 45%), congestive heart failure, mild pulmonary hypertension, hyperlipidemia, diabetes, coronary artery disease, chronic kidney disease, hypertension, peripheral vascular disease who presents after recently being discharged on 05/25/2011 with SOB.  He was walking from his chair to the bathroom and back he felt some chest tightness when he returned. His oxygen level was noted to be in the low 70s by his finger pulse oximeter. He did report having some chest tightness until his oxygen level returned back into the 90s. He took some deep breaths through his nose and his oxygen level seemed to improve. He had noticed increased shortness of breath at baseline for 2 days.   He reports that his nonproductive cough is still ongoing despite treatment during his last two admissions. This will be his third admission to the hospital since December 2012. The patient denies any palpitations or arrhythmia. No presyncope or syncope. No change in his weight. He has been monitoring his weight daily With no significant change in his weight. No worsening lower extremity swelling. He describes his pain as being on the left side and dull. No radiation. No associated nausea, vomiting, or diaphoresis, but did have the shortness of breath.    Present Illness . SOCIAL HISTORY: He lives in [place] with his wife. Quit tobacco about 18 years ago.Smoked 40 years.  No alcohol or drug use. He is retired from [employer], previously worked with [employer].   FAMILY HISTORY: Brother had lung cancer. His father also died of lung cancer.   Physical Exam:   GEN WD, WN, NAD    HEENT red conjunctivae    NECK supple  No masses    RESP postive use of accessory muscles  rhonchi    CARD Regular rate and rhythm  Murmur    Murmur Systolic    ABD denies tenderness  soft    LYMPH  negative neck    SKIN normal to palpation, skin turgor good    NEURO motor/sensory function intact    PSYCH alert, A+O to time, place, person, good insight   Review of Systems:   Subjective/Chief Complaint SOB,  chest pain resolved    General: Fatigue    Skin: No Complaints    ENT: No Complaints    Eyes: No Complaints    Neck: No Complaints    Respiratory: Short of breath    Cardiovascular: Chest pain or discomfort    Gastrointestinal: No Complaints    Genitourinary: No Complaints    Vascular: No Complaints    Musculoskeletal: No Complaints    Neurologic: No Complaints    Hematologic: No Complaints    Endocrine: No Complaints    Psychiatric: No Complaints    Review of Systems: All other systems were reviewed and found to be negative    Medications/Allergies Reviewed Medications/Allergies reviewed     Pulmonary Fibrosis:    CHF:    Hypercholesterolemia:    MI - Myocardial Infarct:    diabetes:    gout:    hypertension:    stomach bypass:    bypass:        Admit Diagnosis:   ACUTE ON CHRONIC RESP FAILURE: 13-Jun-2011, Active, ACUTE ON CHRONIC RESP FAILURE      Admit Reason:   Acute and chronic respiratory failure: (518.84) Active, ICD9, Acute and chronic respiratory failure  Home Medications:  ranitidine 150 mg oral tablet: 1  orally  once a day , Active  clopidogrel 75 mg oral tablet: 1  orally once a day , Active  glipiZIDE 10 mg oral tablet: 1  orally 2 times a day , Active  oxygen: 3-4L/min, Active  flunisolide 25 mcg/inh nasal spray: 2 spray(s) nasal 2 times a day , Active  [employer] 0.65% nasal solution: 1 spray(s) nasal 2 times a day , Active  carvedilol 12.5 mg oral tablet: 1 tab(s) orally 2 times a day, Active  allopurinol 100 mg oral tablet: 1 tab(s) orally once a day, Active  Lasix 40 mg oral tablet: 41m in morning and 230min evening, Active  aspirin 81 mg oral tablet: 1 tab(s) orally once a day, Active  Novolin N: 5 unit(s)  subcutaneous once a day (at bedtime), Active  Imdur 30 mg oral tablet, extended release: 1 tab(s) orally once a day (in the morning), Active  Keppra 250 mg oral tablet: tab(s) orally 2 times a day, Active  nitroglycerin 0.4 mg sublingual tablet: 1 tab(s) sublingual every 5 minutes, As Needed, Active  finasteride 5 mg oral tablet: 1 tab(s) orally once a day, Active  ipratropium 21 mcg/inh (0.03%) nasal spray: spray(s) nasal 2 times a day, Active  lisinopril 20 mg oral tablet: 1 tab(s) orally once a day, Active  Lipitor 80 mg oral tablet: 1 tab(s) orally once a day (at bedtime), Active  cholecalciferol 50,000 intl units oral capsule: 1 cap(s) orally once a month, Active    Routine Hem:  08-Jan-13 00:47    WBC (CBC) 7.8   RBC (CBC) 3.79   Hemoglobin (CBC) 11.0   Hematocrit (CBC) 33.6   Platelet Count (CBC) 151   MCV 89   MCH 28.9   MCHC 32.6   RDW 15.2   Neutrophil % 71.2   Lymphocyte % 15.3   Monocyte % 8.1   Eosinophil % 5.0   Basophil % 0.4   Neutrophil # 5.6   Lymphocyte # 1.2   Monocyte # 0.6   Eosinophil # 0.4   Basophil # 0.0  Routine Chem:  08-Jan-13 00:47    Glucose, Serum 175   BUN 43   Creatinine (comp) 2.06   Sodium, Serum 141   Potassium, Serum 4.8   Chloride, Serum 103   CO2, Serum 34   Calcium (Total), Serum 8.8  Hepatic:  08-Jan-13 00:47    Bilirubin, Total 0.5   Alkaline Phosphatase 110   SGPT (ALT) 20   SGOT (AST) 20   Total Protein, Serum 6.8   Albumin, Serum 2.7  Routine Chem:  08-Jan-13 00:47    Osmolality (calc) 296   eGFR (African American) 40   eGFR (Non-African American) 33   Anion Gap 4  Cardiac:  08-Jan-13 00:47    Troponin I < 0.02  Routine Chem:  08-Jan-13 00:47    B-Type Natriuretic Peptide ([id number]   EKG:   Interpretation EKG shows NSR with rate of 79 bpm, PVCs, LAFB, IVCD   Radiology Results: XRay:    08-Jan-13 01:02, Chest Portable Single View   Chest Portable Single View    REASON FOR EXAM:    sob  COMMENTS:        PROCEDURE: DXR - DXR PORTABLE CHEST SINGLE VIEW  - Jun 13 2011  1:02AM     RESULT: Comparison is made to the study of 20 May 2011.    The pulmonary interstitium demonstrates increased density consistent with   interstitial and early alveolar edema. The pulmonary vascularity is  engorged and indistinct. The cardiac silhouette is enlarged. I see no   pleural effusion.    IMPRESSION:  The findings are consistent with congestive heart failure   with pulmonary interstitial and alveolar edema.      Verified By: DAVID A. Martinique, M.D., MD    Hydralazine: Other  Terazosin HCl: Unknown  Vital Signs/Nurse's Notes: **Vital Signs.:   08-Jan-13 08:26   Temperature Temperature (F) 97.9   Celsius 36.6   Pulse Pulse 67   Respirations Respirations 20   Systolic BP Systolic BP 557   Diastolic BP (mmHg) Diastolic BP (mmHg) 79   Mean BP 107   Pulse Ox % Pulse Ox % 100   Pulse Ox Activity Level  At rest   Oxygen Delivery 4L     Impression IMPRESSION:  1. 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 Residual cough from recent PNA/bronchitis Possible component of acute on chronic systolic and diastolic CHF (still eats out every day for lunch) ---->Gentle diuresis, close monitoring of renal function ----> Needs inhalers at home for COPD, also possibly nebs for home. ----> Will defer to medical service if he needs prednisone given hypoxia.  2) Chest pain: Suspect secondary to angina in setting of severe CAD an hypoxia with walking Uncertain if oxygenation will improve much with diuresis and time (recovery from bronchitis) He does report better oxygen levels prior to bronchitis We did discuss stress test on last admission. Don't know if he could handle lexiscan/exercise or dobutamine at this time.   3. Renal  insufficiency.  --------------> At baseline. Would continue lasix  4) HTN: BP very elevated Will restart imdur 30 mg daily This can be titrated upwards as needed. Could also add amlodipine   Electronic Signatures: Ida Rogue (MD)  (Signed 08-Jan-13 17:48)  Authored: General Aspect/Present Illness, History and Physical Exam, Review of System, Past Medical History, Health Issues, Home Medications, Labs, EKG , Radiology, Allergies, Vital Signs/Nurse's Notes, Impression/Plan   Last Updated: 08-Jan-13 17:48 by Ida Rogue (MD)
